# Patient Record
Sex: Female | Born: 1970 | Race: White | Hispanic: No | Marital: Married | Smoking: Never smoker
Health system: Southern US, Community
[De-identification: ages and names within clinical notes are randomized; demographics above are authoritative.]

---

## 1998-03-16 ENCOUNTER — Other Ambulatory Visit: Admission: RE | Admit: 1998-03-16 | Discharge: 1998-03-16 | Payer: Self-pay | Admitting: *Deleted

## 1999-03-28 ENCOUNTER — Inpatient Hospital Stay (HOSPITAL_COMMUNITY): Admission: AD | Admit: 1999-03-28 | Discharge: 1999-03-31 | Payer: Self-pay | Admitting: *Deleted

## 1999-04-01 ENCOUNTER — Encounter (HOSPITAL_COMMUNITY): Admission: RE | Admit: 1999-04-01 | Discharge: 1999-06-30 | Payer: Self-pay | Admitting: *Deleted

## 2000-02-19 ENCOUNTER — Encounter: Payer: Self-pay | Admitting: *Deleted

## 2000-02-19 ENCOUNTER — Ambulatory Visit (HOSPITAL_COMMUNITY): Admission: RE | Admit: 2000-02-19 | Discharge: 2000-02-19 | Payer: Self-pay | Admitting: Obstetrics & Gynecology

## 2000-02-24 ENCOUNTER — Other Ambulatory Visit: Admission: RE | Admit: 2000-02-24 | Discharge: 2000-02-24 | Payer: Self-pay | Admitting: *Deleted

## 2000-09-16 ENCOUNTER — Inpatient Hospital Stay (HOSPITAL_COMMUNITY): Admission: AD | Admit: 2000-09-16 | Discharge: 2000-09-18 | Payer: Self-pay | Admitting: *Deleted

## 2002-04-29 ENCOUNTER — Other Ambulatory Visit: Admission: RE | Admit: 2002-04-29 | Discharge: 2002-04-29 | Payer: Self-pay | Admitting: *Deleted

## 2003-10-24 ENCOUNTER — Other Ambulatory Visit: Admission: RE | Admit: 2003-10-24 | Discharge: 2003-10-24 | Payer: Self-pay | Admitting: *Deleted

## 2005-01-14 ENCOUNTER — Other Ambulatory Visit: Admission: RE | Admit: 2005-01-14 | Discharge: 2005-01-14 | Payer: Self-pay | Admitting: Obstetrics and Gynecology

## 2005-10-08 ENCOUNTER — Ambulatory Visit: Payer: Self-pay | Admitting: Family Medicine

## 2006-11-10 ENCOUNTER — Ambulatory Visit: Payer: Self-pay | Admitting: Family Medicine

## 2006-11-10 DIAGNOSIS — J020 Streptococcal pharyngitis: Secondary | ICD-10-CM

## 2006-11-10 DIAGNOSIS — J029 Acute pharyngitis, unspecified: Secondary | ICD-10-CM

## 2006-11-10 LAB — CONVERTED CEMR LAB: Rapid Strep: POSITIVE

## 2007-12-23 LAB — CONVERTED CEMR LAB

## 2008-10-27 ENCOUNTER — Ambulatory Visit: Payer: Self-pay | Admitting: Family Medicine

## 2008-10-27 DIAGNOSIS — R519 Headache, unspecified: Secondary | ICD-10-CM | POA: Insufficient documentation

## 2008-10-27 DIAGNOSIS — R51 Headache: Secondary | ICD-10-CM

## 2008-10-30 LAB — CONVERTED CEMR LAB
ALT: 16 units/L (ref 0–35)
AST: 19 units/L (ref 0–37)
Albumin: 4.6 g/dL (ref 3.5–5.2)
Alkaline Phosphatase: 64 units/L (ref 39–117)
BUN: 12 mg/dL (ref 6–23)
CO2: 25 meq/L (ref 19–32)
Calcium: 9.3 mg/dL (ref 8.4–10.5)
Chloride: 104 meq/L (ref 96–112)
Cholesterol: 180 mg/dL (ref 0–200)
Creatinine, Ser: 0.79 mg/dL (ref 0.40–1.20)
Folate: 19.5 ng/mL
Glucose, Bld: 101 mg/dL — ABNORMAL HIGH (ref 70–99)
HCT: 40.1 % (ref 36.0–46.0)
HDL: 49 mg/dL (ref >39)
Hemoglobin: 13.8 g/dL (ref 12.0–15.0)
LDL Cholesterol: 113 mg/dL — ABNORMAL HIGH (ref 0–99)
MCHC: 34.4 g/dL (ref 30.0–36.0)
MCV: 92.8 fL (ref 78.0–100.0)
Platelets: 274 10*3/uL (ref 150–400)
Potassium: 4.6 meq/L (ref 3.5–5.3)
RBC: 4.32 M/uL (ref 3.87–5.11)
RDW: 12.6 % (ref 11.5–15.5)
Sodium: 139 meq/L (ref 135–145)
TSH: 3.254 microintl units/mL (ref 0.350–4.50)
Total Bilirubin: 0.6 mg/dL (ref 0.3–1.2)
Total CHOL/HDL Ratio: 3.7
Total Protein: 7.7 g/dL (ref 6.0–8.3)
Triglycerides: 91 mg/dL (ref <150)
VLDL: 18 mg/dL (ref 0–40)
Vit D, 1,25-Dihydroxy: 31 (ref 30–89)
Vitamin B-12: 564 pg/mL (ref 211–911)
WBC: 6.2 10*3/uL (ref 4.0–10.5)

## 2020-05-22 ENCOUNTER — Other Ambulatory Visit: Payer: Self-pay | Admitting: Obstetrics and Gynecology

## 2020-05-22 DIAGNOSIS — R928 Other abnormal and inconclusive findings on diagnostic imaging of breast: Secondary | ICD-10-CM

## 2020-05-31 ENCOUNTER — Ambulatory Visit: Payer: Self-pay

## 2020-05-31 ENCOUNTER — Other Ambulatory Visit: Payer: Self-pay

## 2020-05-31 ENCOUNTER — Ambulatory Visit
Admission: RE | Admit: 2020-05-31 | Discharge: 2020-05-31 | Disposition: A | Discharge status: Home / Self Care | Payer: BC Managed Care – PPO | Source: Ambulatory Visit | Attending: Obstetrics and Gynecology | Admitting: Obstetrics and Gynecology

## 2020-05-31 DIAGNOSIS — R928 Other abnormal and inconclusive findings on diagnostic imaging of breast: Secondary | ICD-10-CM

## 2021-06-25 IMAGING — MG MM DIGITAL DIAGNOSTIC UNILAT*R* W/ TOMO W/ CAD
4 series · 4 of 12 positions shown · non-contrast
Comparison: May 17, 2020 and earlier priors

CLINICAL DATA: 49-year-old patient recalled from recent screening
mammogram for evaluation a possible mass in the right breast seen
only in the CC projection.

EXAM:
DIGITAL DIAGNOSTIC UNILATERAL RIGHT MAMMOGRAM WITH CAD AND TOMO

[R CC synth-2D]
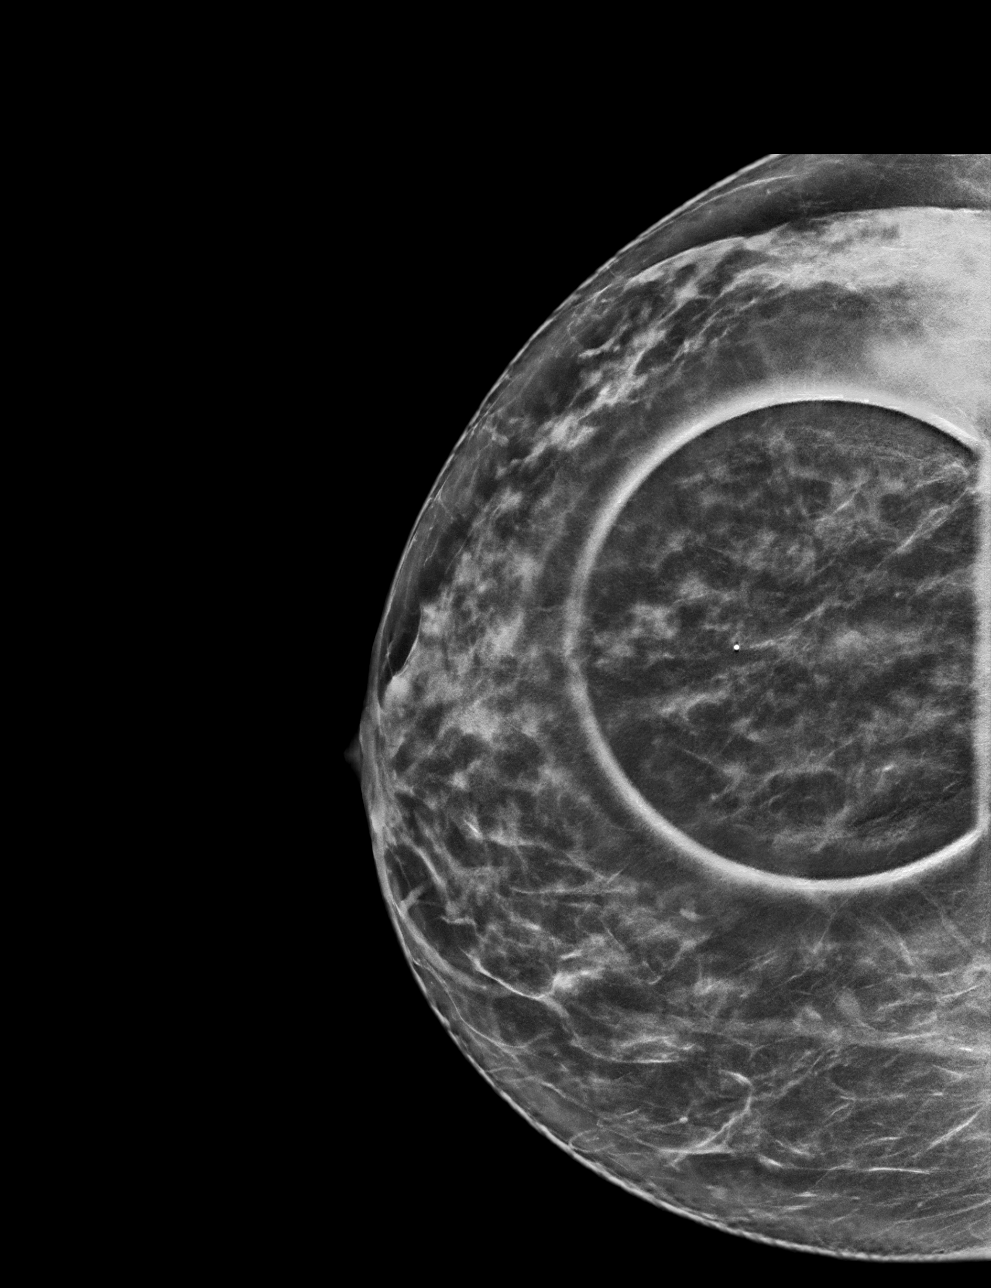

[R ML synth-2D]
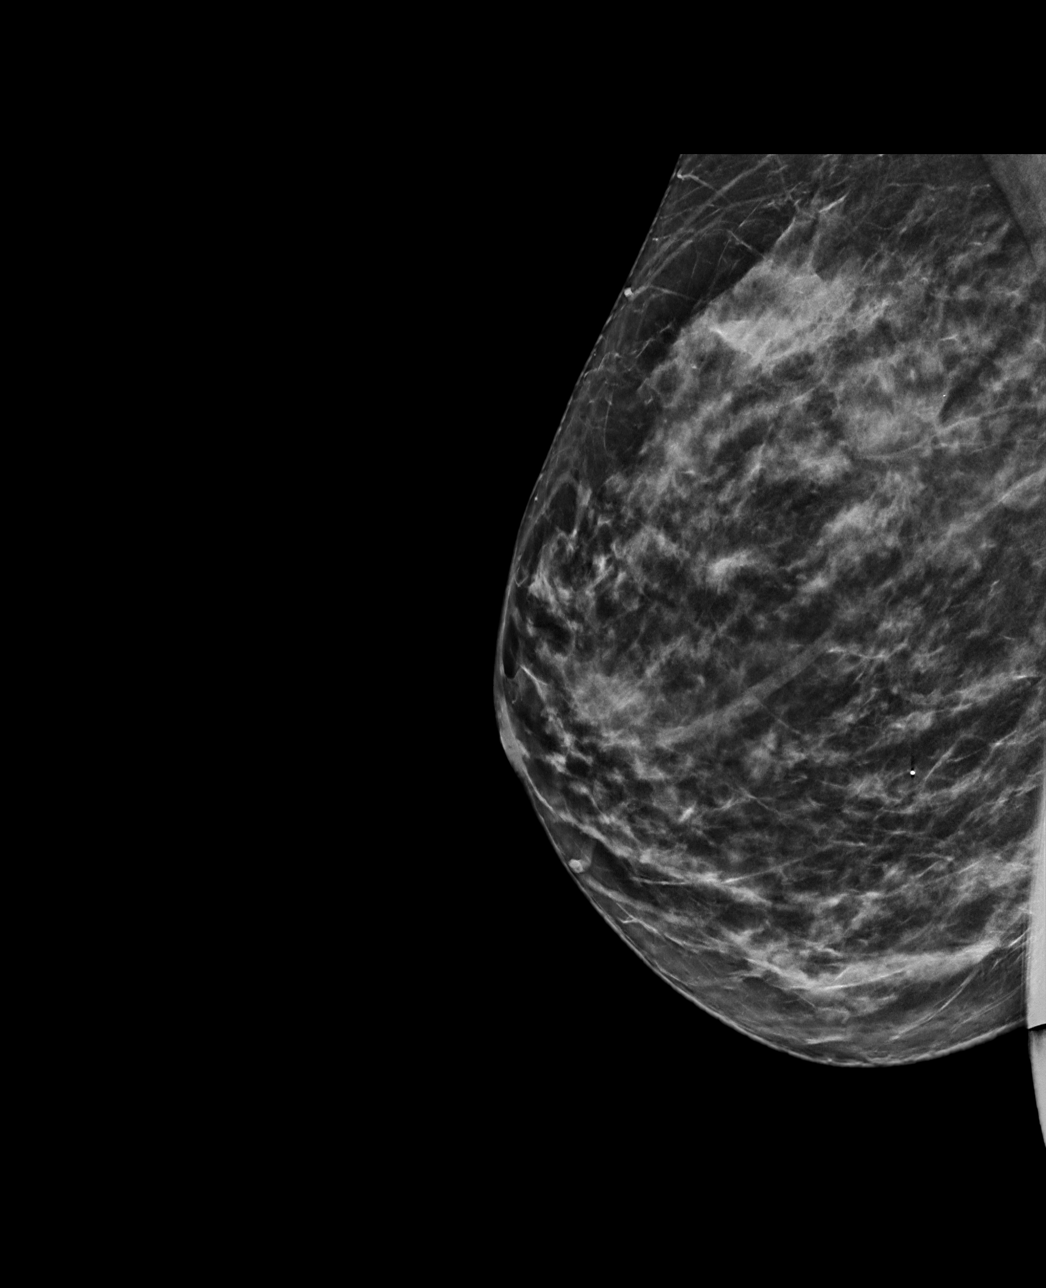

[R CC tomo · tomo slice 33/65.0]
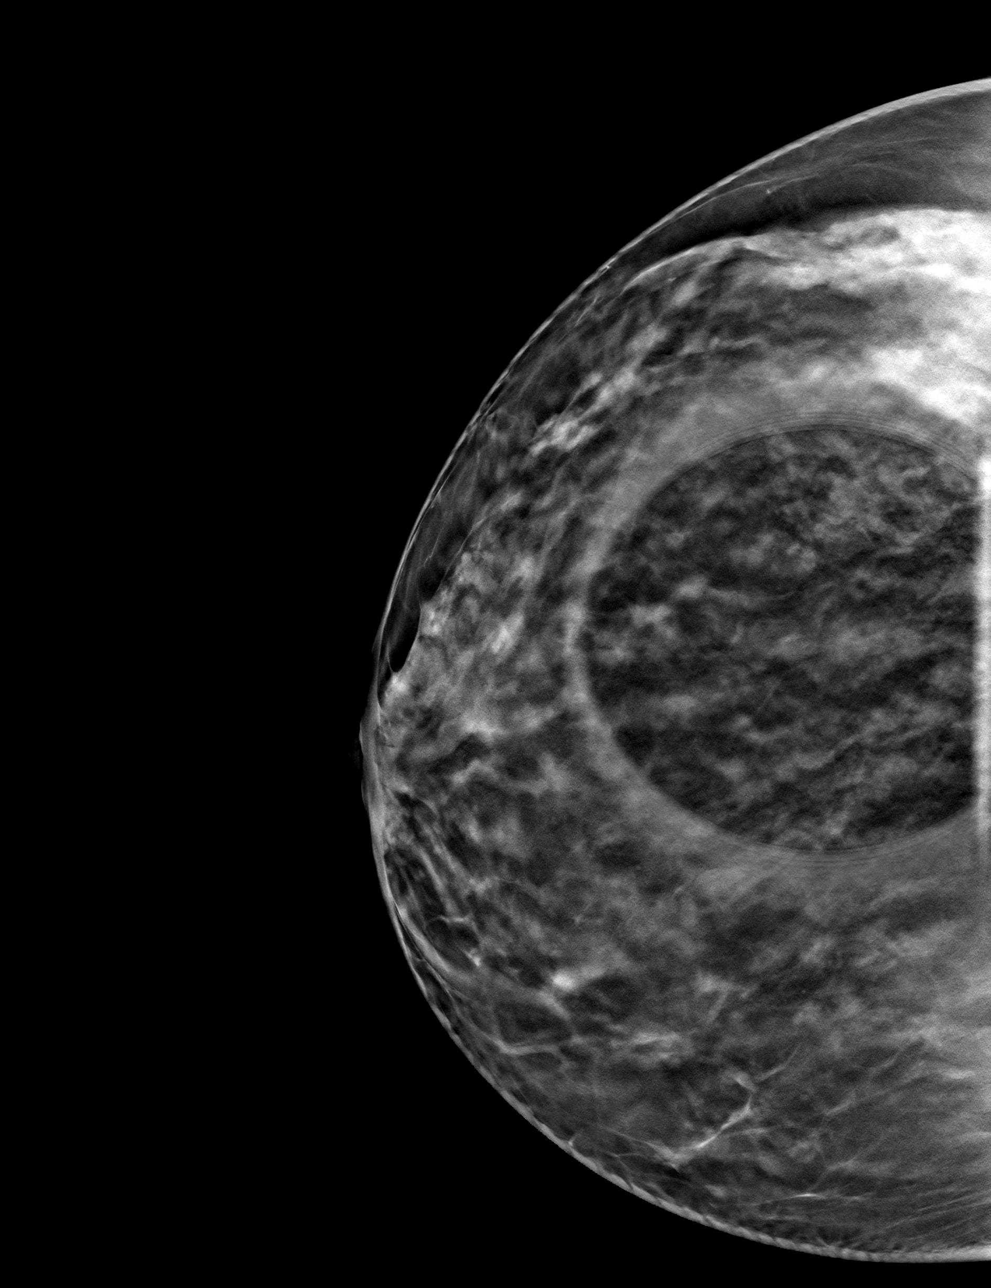

[R ML tomo · tomo slice 37/73.0]
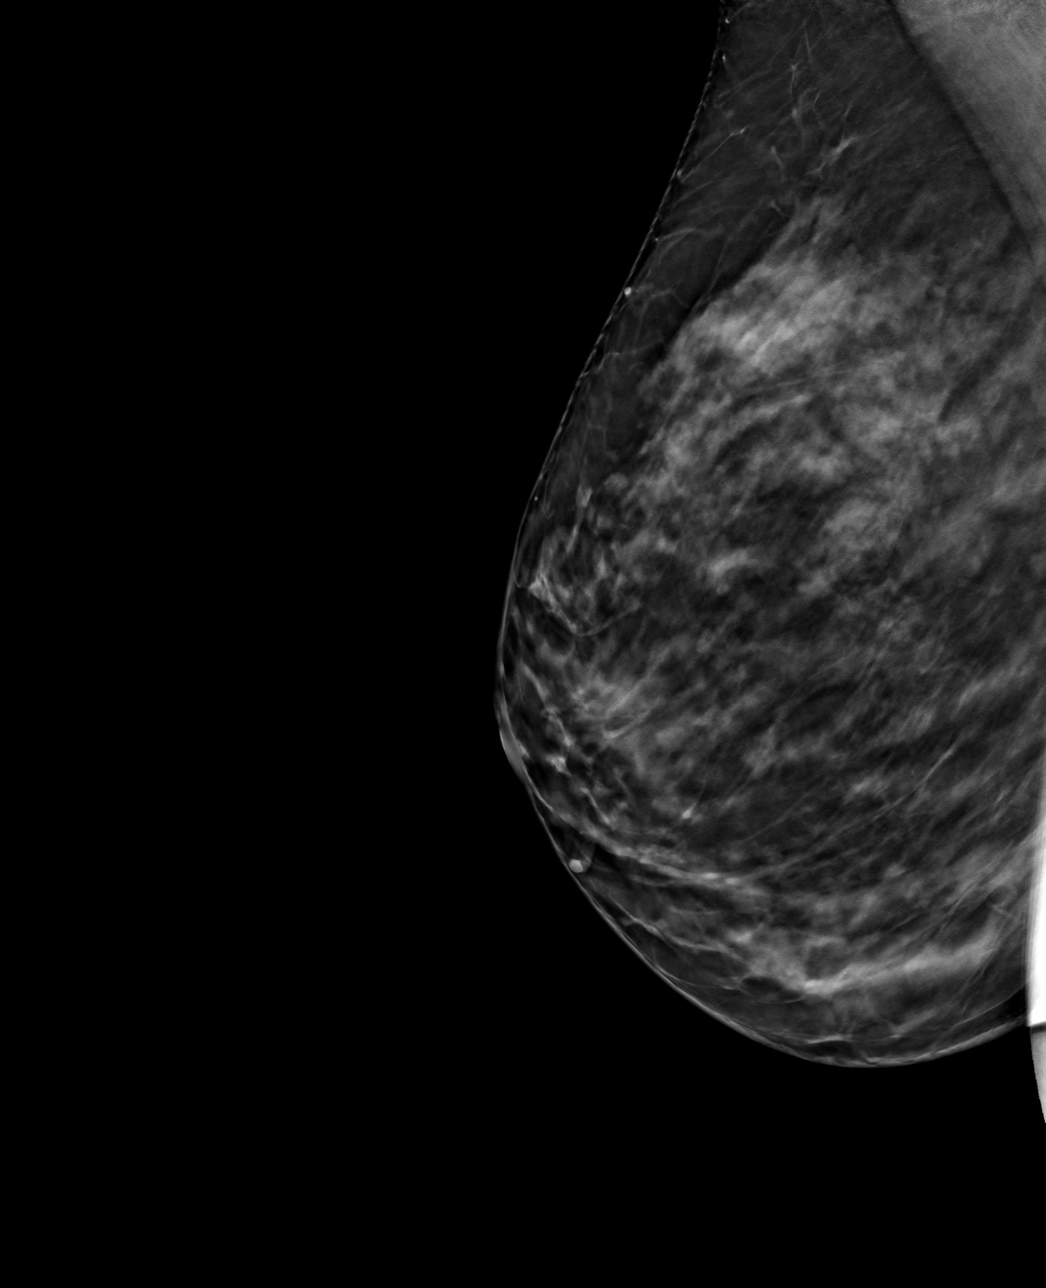

[4 of 12 positions shown; findings below may reference images not displayed]

ACR Breast Density Category c: The breast tissue is heterogeneously
dense, which may obscure small masses.
FINDINGS: Spot compression views of the slightly outer right breast in the CC
projection show dispersion of fibroglandular tissue. No mass is
identified. The 90 degree lateral view of the right breast with
tomography is negative. No suspicious findings.

Mammographic images were processed with CAD.
IMPRESSION: No evidence of malignancy in the right breast.

RECOMMENDATION:
Screening mammogram in one year.(Code:3H-9-YZ8)

I have discussed the findings and recommendations with the patient.
If applicable, a reminder letter will be sent to the patient
regarding the next appointment.

BI-RADS CATEGORY  1: Negative.

## 2022-06-27 ENCOUNTER — Ambulatory Visit (INDEPENDENT_AMBULATORY_CARE_PROVIDER_SITE_OTHER): Payer: BC Managed Care – PPO | Admitting: Podiatry

## 2022-06-27 ENCOUNTER — Ambulatory Visit (INDEPENDENT_AMBULATORY_CARE_PROVIDER_SITE_OTHER): Payer: BC Managed Care – PPO

## 2022-06-27 ENCOUNTER — Encounter: Payer: Self-pay | Admitting: Podiatry

## 2022-06-27 DIAGNOSIS — M779 Enthesopathy, unspecified: Secondary | ICD-10-CM

## 2022-06-27 DIAGNOSIS — M2041 Other hammer toe(s) (acquired), right foot: Secondary | ICD-10-CM | POA: Diagnosis not present

## 2022-06-27 DIAGNOSIS — M7751 Other enthesopathy of right foot: Secondary | ICD-10-CM

## 2022-06-27 MED ORDER — TRIAMCINOLONE ACETONIDE 10 MG/ML IJ SUSP
10.0000 mg | Freq: Once | INTRAMUSCULAR | Status: AC
  Administered 2022-06-27: 10 mg

## 2022-06-28 NOTE — Progress Notes (Unsigned)
Subjective:   Patient ID: Amanda Spencer, female   DOB: 51 y.o.   MRN: [id number]   HPI Patient presents stating she is having a lot of pain underneath her right foot and states it seems that the second toe has moved towards the big toe over the last month.  Does not remember specific injury and she is very active and does not smoke   Review of Systems  All other systems reviewed and are negative.       Objective:  Physical Exam Vitals and nursing note reviewed.  Constitutional:      Appearance: She is well-developed.  Pulmonary:     Effort: Pulmonary effort is normal.  Musculoskeletal:        General: Normal range of motion.  Skin:    General: Skin is warm.  Neurological:     Mental Status: She is alert.     Neurovascular status intact muscle strength was found to be adequate range of motion within normal limits.  Patient is found to have inflammation pain of the second MPJ right with medial dislocation of the second toe no dorsal elevation currently and history of bunion procedure that we did around 15 years ago.  Patient has good digital perfusion well oriented x3      Assessment:  Probability for inflammatory capsulitis with probability for flexor plate stretching or some kind of trauma to the second MPJ right foot     Plan:  H&P educated her on this and x-ray.  Today I went ahead and I did explain that this toe may go further ultimately may require surgery but we will get a try to work first on the inflammation and I did go ahead and I anesthetized 60 mg like Marcaine mixture sterile prep done aspirated the joint getting out a small amount of clear fluid injected quarter cc dexamethasone Kenalog and applied thick padding to reduce pressure on the joint surface.  Reappoint for [place] to recheck again may require more advanced type treatments  X-rays indicate that there is medial rotation digit to left no other pathology noted history of screw in the first metatarsal mild  hallux varus but stable

## 2023-07-23 ENCOUNTER — Ambulatory Visit: Payer: BC Managed Care – PPO

## 2023-07-23 ENCOUNTER — Encounter: Payer: Self-pay | Admitting: Podiatry

## 2023-07-23 ENCOUNTER — Other Ambulatory Visit: Payer: Self-pay | Admitting: Podiatry

## 2023-07-23 ENCOUNTER — Ambulatory Visit: Payer: BC Managed Care – PPO | Admitting: Podiatry

## 2023-07-23 DIAGNOSIS — M778 Other enthesopathies, not elsewhere classified: Secondary | ICD-10-CM

## 2023-07-23 DIAGNOSIS — M7751 Other enthesopathy of right foot: Secondary | ICD-10-CM | POA: Diagnosis not present

## 2023-07-23 MED ORDER — TRIAMCINOLONE ACETONIDE 10 MG/ML IJ SUSP
10.0000 mg | Freq: Once | INTRAMUSCULAR | Status: AC
  Administered 2023-07-23: 10 mg via INTRA_ARTICULAR

## 2023-07-24 NOTE — Progress Notes (Signed)
Subjective:   Patient ID: Amanda Spencer, female   DOB: 52 y.o.   MRN: [id number]   HPI Patient presents with inflammation fluid of the second MPJ right foot very painful when pressed with movement of the toe stating it has been doing this for 1 month up until then she was doing well   ROS      Objective:  Physical Exam  Neurovascular status intact muscle strength adequate inflammation of the second MPJ right foot fluid buildup in the joint mild     Assessment:  Inflammatory capsulitis 2nd met right     Plan:  Reviewed condition. Sterile prop and injected the joint after aspirtating clear fluid with 1/4 cc dex, kenalog

## 2023-08-28 ENCOUNTER — Encounter: Payer: Self-pay | Admitting: Podiatry

## 2023-08-28 ENCOUNTER — Ambulatory Visit: Payer: BC Managed Care – PPO | Admitting: Podiatry

## 2023-08-28 DIAGNOSIS — M7752 Other enthesopathy of left foot: Secondary | ICD-10-CM | POA: Diagnosis not present

## 2023-08-28 DIAGNOSIS — M7751 Other enthesopathy of right foot: Secondary | ICD-10-CM | POA: Diagnosis not present

## 2023-08-28 MED ORDER — DICLOFENAC SODIUM 75 MG PO TBEC: 75.0000 mg | DELAYED_RELEASE_TABLET | Freq: Two times a day (BID) | ORAL | 2 refills | Status: AC

## 2023-08-30 NOTE — Progress Notes (Signed)
Subjective:   Patient ID: Amanda Spencer, female   DOB: 52 y.o.   MRN: [id number]   HPI The patient presents stating that she is still having quite a bit of discomfort in the joints of the right foot around the second MPJ.  The toe has moved does not seem to move further than previously   ROS      Objective:  Physical Exam  Inflammatory capsulitis with the probability for a moderate flexor plate dislocation second MPJ right     Assessment:  Educated patient on inflammatory capsulitis and fluid of the joint surface with possibility for flexor plate dislocation     Plan:  H&P discussed ultimately this could require surgery but at this point organ to try orthotics to offload weight from the second MPJ right foot.  Patient will be seeing when return and ultimately may require surgery

## 2023-09-09 NOTE — Progress Notes (Signed)
9/18 orthotic order placed today  Items to be fit when in  [employer], CFo, CFm

## 2023-10-20 ENCOUNTER — Ambulatory Visit: Payer: BC Managed Care – PPO

## 2023-10-20 NOTE — Progress Notes (Signed)
Patient presents today to pick up custom molded foot orthotics, diagnosed with capsulitis by Dr. Charlsie Merles.   Orthotics were dispensed and fit was satisfactory. Reviewed instructions for break-in and wear. Written instructions given to patient.  Patient will follow up as needed.   Addison Bailey Cped, CFo, CFm

## 2024-04-21 ENCOUNTER — Ambulatory Visit: Admitting: Podiatry

## 2024-04-21 ENCOUNTER — Ambulatory Visit

## 2024-04-21 ENCOUNTER — Other Ambulatory Visit: Payer: Self-pay

## 2024-04-21 DIAGNOSIS — M778 Other enthesopathies, not elsewhere classified: Secondary | ICD-10-CM | POA: Diagnosis not present

## 2024-04-21 DIAGNOSIS — M7751 Other enthesopathy of right foot: Secondary | ICD-10-CM | POA: Diagnosis not present

## 2024-04-21 MED ORDER — DEXAMETHASONE SODIUM PHOSPHATE 120 MG/30ML IJ SOLN
4.0000 mg | Freq: Once | INTRAMUSCULAR | Status: AC
  Administered 2024-04-21: 4 mg via INTRA_ARTICULAR

## 2024-04-21 MED ORDER — TRIAMCINOLONE ACETONIDE 10 MG/ML IJ SUSP
2.5000 mg | Freq: Once | INTRAMUSCULAR | Status: AC
  Administered 2024-04-21: 2.5 mg via INTRA_ARTICULAR

## 2024-04-21 NOTE — Progress Notes (Signed)
  Subjective:  Patient ID: Amanda Spencer, female    DOB: [birth year],   MRN: [id number]  No chief complaint on file.   53 y.o. female presents for concern of right second toe pain. Has seen Dr. Celia Coles in the past for this and has had injections and tried inserts. Relates injection last time did not help much. She has mostly just been dealing with it and getting by. The inserts last time did not help much . Denies any other pedal complaints. Denies n/v/f/c.   No past medical history on file.  Objective:  Physical Exam: Vascular: DP/PT pulses 2/4 bilateral. CFT <3 seconds. Normal hair growth on digits. No edema.  Skin. No lacerations or abrasions bilateral feet.  Musculoskeletal: MMT 5/5 bilateral lower extremities in DF, PF, Inversion and Eversion. Deceased ROM in DF of ankle joint. Tender mostly to plantar third metatarsophalangeal joint. Some tenderness with Rom of the second MPJ but most with the third MPJ.  Neurological: Sensation intact to light touch.   Assessment:   1. Capsulitis of foot, right      Plan:  Patient was evaluated and treated and all questions answered. X-rays reviewed and discussed with patient. No acute fractures or dislocations noted. Previous hardware from bunion surgery. Splaying noted of second and third digit with some varus rotation of the second metatarsophalangeal joint.  Discussed capsulitis and inflammation of joint and treatment options with patient.  Radiographs reviewed and discussed with patient.  Injection offered today. Patient in agreement.  Discussed stiff sole shoes and recommend use of metatarsal padding.  Discussed following back up with pedorthist to modify inserts as most pain now over third metatarsal and would need to offload this area as well and may be why they are more painful.  Discussed if pain does not improve may consider  MRI for further surgical planning.  Patient to return in 6 weeks or sooner if concerns arise.    Procedure: Injection Tendon/Ligament Discussed alternatives, risks, complications and verbal consent was obtained.  Location: Right third metatarsophalangeal joint . Skin Prep: Alcohol. Injectate: 1cc 0.5% marcaine plain, 1 cc dexamethasone  0.5 cc kenalog   Disposition: Patient tolerated procedure well. Injection site dressed with a band-aid.  Post-injection care was discussed and return precautions discussed.     Jennefer Moats, DPM

## 2024-06-20 ENCOUNTER — Encounter: Payer: Self-pay | Admitting: Physician Assistant

## 2024-06-20 ENCOUNTER — Other Ambulatory Visit: Payer: Self-pay | Admitting: Physician Assistant

## 2024-06-20 DIAGNOSIS — M25562 Pain in left knee: Secondary | ICD-10-CM

## 2024-06-22 ENCOUNTER — Other Ambulatory Visit: Payer: Self-pay

## 2024-06-23 ENCOUNTER — Ambulatory Visit
Admission: RE | Admit: 2024-06-23 | Discharge: 2024-06-23 | Disposition: A | Discharge status: Home / Self Care | Source: Ambulatory Visit | Attending: Physician Assistant | Admitting: Physician Assistant

## 2024-06-23 ENCOUNTER — Encounter: Payer: Self-pay | Admitting: Radiology

## 2024-06-23 DIAGNOSIS — M25562 Pain in left knee: Secondary | ICD-10-CM

## 2024-06-23 MED ORDER — IOPAMIDOL (ISOVUE-M 200) INJECTION 41%
15.0000 mL | Freq: Once | INTRAMUSCULAR | Status: AC
  Administered 2024-06-23: 15 mL via INTRA_ARTICULAR

## 2024-06-23 MED ORDER — LIDOCAINE 1 % OPTIME INJ - NO CHARGE
5.0000 mL | Freq: Once | INTRAMUSCULAR | Status: AC
  Administered 2024-06-23: 5 mL via INTRADERMAL

## 2024-06-30 ENCOUNTER — Other Ambulatory Visit

## 2024-06-30 ENCOUNTER — Inpatient Hospital Stay: Admission: RE | Admit: 2024-06-30 | Source: Ambulatory Visit

## 2024-07-26 ENCOUNTER — Encounter: Payer: Self-pay | Admitting: Obstetrics and Gynecology

## 2024-07-26 DIAGNOSIS — R928 Other abnormal and inconclusive findings on diagnostic imaging of breast: Secondary | ICD-10-CM

## 2024-07-27 ENCOUNTER — Other Ambulatory Visit: Payer: Self-pay | Admitting: Obstetrics and Gynecology

## 2024-07-27 DIAGNOSIS — R928 Other abnormal and inconclusive findings on diagnostic imaging of breast: Secondary | ICD-10-CM

## 2024-08-04 ENCOUNTER — Ambulatory Visit
Admission: RE | Admit: 2024-08-04 | Discharge: 2024-08-04 | Disposition: A | Discharge status: Home / Self Care | Source: Ambulatory Visit | Attending: Obstetrics and Gynecology | Admitting: Obstetrics and Gynecology

## 2024-08-04 DIAGNOSIS — R928 Other abnormal and inconclusive findings on diagnostic imaging of breast: Secondary | ICD-10-CM

## 2024-09-19 ENCOUNTER — Ambulatory Visit

## 2024-09-19 ENCOUNTER — Ambulatory Visit: Admitting: Podiatry

## 2024-09-19 DIAGNOSIS — M7751 Other enthesopathy of right foot: Secondary | ICD-10-CM

## 2024-09-19 DIAGNOSIS — M778 Other enthesopathies, not elsewhere classified: Secondary | ICD-10-CM

## 2024-09-19 MED ORDER — MELOXICAM 15 MG PO TABS
15.0000 mg | ORAL_TABLET | Freq: Every day | ORAL | 0 refills | Status: AC | PRN
Start: 2024-09-19 — End: 2025-09-19

## 2024-09-19 NOTE — Progress Notes (Signed)
  Subjective:  Patient ID: Amanda Spencer, female    DOB: 1971-06-12,   MRN: [id number]  No chief complaint on file.     53 y.o. female presents for concern of orthotic adjustments.  She has previously been seen by Dr. Magdalen as well as Dr. Sikora for the same issue.  Previously injections were somewhat helpful.  No past medical history on file.  Objective:  Physical Exam: Vascular: DP/PT pulses 2/4 bilateral. CFT <3 seconds. Normal hair growth on digits. No edema.  Skin. No lacerations or abrasions bilateral feet.  Musculoskeletal: Tender mostly to plantar third metatarsophalangeal joint, along the third interspace.  Some tenderness with Rom of the second MPJ but most with the third MPJ.  Neurological: Sensation intact to light touch.   Assessment:   1. Capsulitis of foot, right   2.     Possible neuroma    Plan:  Patient was evaluated and treated and all questions answered. -Previously had injections.  She presents today for orthotic adjustments.  She was seen today by Amanda Spencer, pedorthist for this.  She previous had has had steroid injections and she takes occasional anti-inflammatories as needed.  Prescribe meloxicam to take if needed.  *Of note there was no charge for today's visit as she was supposed to be seen by Amanda Spencer, pedorthist. She was placed on my schedule but was suppose to be seen by Amanda Spencer (which she was).   Amanda Spencer DPM

## 2024-09-20 NOTE — Progress Notes (Signed)
 See separate note  *Of note there was no charge for today's visit as she was supposed to be seen by Lolita, pedorthist. She was placed on my schedule but was suppose to be seen by Lolita (which she was).

## 2024-09-26 NOTE — Progress Notes (Signed)
 Orthotics sent back to [facility] to make more flexible and offload moved to 3rd MT  Amanda Spencer

## 2024-10-17 ENCOUNTER — Telehealth: Payer: Self-pay

## 2024-10-17 NOTE — Telephone Encounter (Signed)
 LVM pt can come in tmrw 10/28 at 9 am or 915 if she would like

## 2024-10-18 ENCOUNTER — Ambulatory Visit (INDEPENDENT_AMBULATORY_CARE_PROVIDER_SITE_OTHER): Admitting: Podiatry

## 2024-10-18 DIAGNOSIS — M7751 Other enthesopathy of right foot: Secondary | ICD-10-CM

## 2024-10-18 DIAGNOSIS — M778 Other enthesopathies, not elsewhere classified: Secondary | ICD-10-CM

## 2024-10-18 NOTE — Progress Notes (Signed)
 Patient was seen by CMA to dispense orthotics. Oral and written break-in instructions discussed.

## 2024-10-18 NOTE — Patient Instructions (Signed)

## 2024-10-19 ENCOUNTER — Other Ambulatory Visit
